# Patient Record
Sex: Female | Born: 2005 | Race: Asian | Hispanic: No | Marital: Single | Smoking: Never smoker
Health system: Southern US, Community
[De-identification: ages and names within clinical notes are randomized; demographics above are authoritative.]

## PROBLEM LIST (undated history)

## (undated) DIAGNOSIS — L309 Dermatitis, unspecified: Secondary | ICD-10-CM

## (undated) DIAGNOSIS — L709 Acne, unspecified: Secondary | ICD-10-CM

## (undated) DIAGNOSIS — E860 Dehydration: Secondary | ICD-10-CM

## (undated) DIAGNOSIS — J302 Other seasonal allergic rhinitis: Secondary | ICD-10-CM

## (undated) HISTORY — DX: Acne, unspecified: L70.9

## (undated) HISTORY — DX: Dermatitis, unspecified: L30.9

## (undated) HISTORY — DX: Dehydration: E86.0

## (undated) HISTORY — DX: Other seasonal allergic rhinitis: J30.2

---
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
## 2024-03-28 ENCOUNTER — Ambulatory Visit: Payer: Self-pay | Admitting: Pediatrics

## 2024-03-28 DIAGNOSIS — Z0000 Encounter for general adult medical examination without abnormal findings: Secondary | ICD-10-CM

## 2024-05-20 ENCOUNTER — Ambulatory Visit (INDEPENDENT_AMBULATORY_CARE_PROVIDER_SITE_OTHER): Payer: Self-pay | Admitting: Pediatrics

## 2024-05-20 ENCOUNTER — Encounter: Payer: Self-pay | Admitting: Pediatrics

## 2024-05-20 VITALS — BP 118/70 | Ht 60.8 in | Wt 113.8 lb

## 2024-05-20 DIAGNOSIS — Z23 Encounter for immunization: Secondary | ICD-10-CM

## 2024-05-20 DIAGNOSIS — Z0000 Encounter for general adult medical examination without abnormal findings: Secondary | ICD-10-CM

## 2024-05-20 DIAGNOSIS — Z1339 Encounter for screening examination for other mental health and behavioral disorders: Secondary | ICD-10-CM | POA: Diagnosis not present

## 2024-05-20 DIAGNOSIS — Z68.41 Body mass index (BMI) pediatric, 5th percentile to less than 85th percentile for age: Secondary | ICD-10-CM | POA: Diagnosis not present

## 2024-05-20 MED ORDER — NORETHIN ACE-ETH ESTRAD-FE 1-20 MG-MCG PO TABS: 1.0000 | ORAL_TABLET | Freq: Every day | ORAL | 11 refills | Status: AC

## 2024-05-20 NOTE — Patient Instructions (Signed)
 At [facility] we value your feedback. You may receive a survey about your visit today. Please share your experience as we strive to create trusting relationships with our patients to provide genuine, compassionate, quality care.   Preventive Care 43-19 Years Old, Female Preventive care refers to lifestyle choices and visits with your health care provider that can promote health and wellness. At this stage in your life, you may start seeing a primary care physician instead of a pediatrician for your preventive care. Preventive care visits are also called wellness exams. What can I expect for my preventive care visit? Counseling During your preventive care visit, your health care provider may ask about your: Medical history, including: Past medical problems. Family medical history. Pregnancy history. Current health, including: Menstrual cycle. Method of birth control. Emotional well-being. Home life and relationship well-being. Sexual activity and sexual health. Lifestyle, including: Alcohol, nicotine or tobacco, and drug use. Access to firearms. Diet, exercise, and sleep habits. Sunscreen use. Motor vehicle safety. Physical exam Your health care provider may check your: Height and weight. These may be used to calculate your BMI (body mass index). BMI is a measurement that tells if you are at a healthy weight. Waist circumference. This measures the distance around your waistline. This measurement also tells if you are at a healthy weight and may help predict your risk of certain diseases, such as type 2 diabetes and high blood pressure. Heart rate and blood pressure. Body temperature. Skin for abnormal spots. Breasts. What immunizations do I need? Vaccines are usually given at various ages, according to a schedule. Your health care provider will recommend vaccines for you based on your age, medical history, and lifestyle or other factors, such as travel or where you  work. What tests do I need? Screening Your health care provider may recommend screening tests for certain conditions. This may include: Vision and hearing tests. Lipid and cholesterol levels. Pelvic exam and Pap test. Hepatitis B test. Hepatitis C test. HIV (human immunodeficiency virus) test. STI (sexually transmitted infection) testing, if you are at risk. Tuberculosis skin test if you have symptoms. BRCA-related cancer screening. This may be done if you have a family history of breast, ovarian, tubal, or peritoneal cancers. Talk with your health care provider about your test results, treatment options, and if necessary, the need for more tests. Follow these instructions at home: Eating and drinking Eat a healthy diet that includes fresh fruits and vegetables, whole grains, lean protein, and low-fat dairy products. Drink enough fluid to keep your urine pale yellow. Do not drink alcohol if: Your health care provider tells you not to drink. You are pregnant, may be pregnant, or are planning to become pregnant. You are under the legal drinking age. In the [place] the legal drinking age is 40. If you drink alcohol: Limit how much you have to 0-1 drink a day. Know how much alcohol is in your drink. In the [place] one drink equals one 12 oz bottle of beer (355 mL), one 5 oz glass of wine (148 mL), or one 1 oz glass of hard liquor (44 mL). Lifestyle Brush your teeth every morning and night with fluoride toothpaste. Floss one time each day. Exercise for at least 30 minutes 5 or more days of the week. Do not use any products that contain nicotine or tobacco. These products include cigarettes, chewing tobacco, and vaping devices, such as e-cigarettes. If you need help quitting, ask your health care provider. Do not use drugs. If you are  sexually active, practice safe sex. Use a condom or other form of protection to prevent STIs. If you do not wish to become pregnant, use a form of birth control.  If you plan to become pregnant, see your health care provider for a prepregnancy visit. Find healthy ways to manage stress, such as: Meditation, yoga, or listening to music. Journaling. Talking to a trusted person. Spending time with friends and family. Safety Always wear your seat belt while driving or riding in a vehicle. Do not drive: If you have been drinking alcohol. Do not ride with someone who has been drinking. When you are tired or distracted. While texting. If you have been using any mind-altering substances or drugs. Wear a helmet and other protective equipment during sports activities. If you have firearms in your house, make sure you follow all gun safety procedures. Seek help if you have been bullied, physically abused, or sexually abused. Use the internet responsibly to avoid dangers, such as online bullying and online sex predators. What's next? Go to your health care provider once a year for an annual wellness visit. Ask your health care provider how often you should have your eyes and teeth checked. Stay up to date on all vaccines. This information is not intended to replace advice given to you by your health care provider. Make sure you discuss any questions you have with your health care provider. Document Revised: 12/16/2020 Document Reviewed: 12/16/2020 Elsevier Patient Education  2024 [employer].

## 2024-05-20 NOTE — Progress Notes (Unsigned)
 Subjective:     History was provided by the patient.  Angelica Bradley is a 18 y.o. female who is here for this well-child visit.  Immunization History  Administered Date(s) Administered   DTaP 08/03/2005, 10/12/2005, 12/20/2005, 08/30/2006, 06/10/2009   HIB (PRP-OMP) 08/03/2005, 10/12/2005, 04/20/2006, 06/10/2009   HPV 9-valent 01/28/2020, 02/08/2021   Hepatitis A 08/30/2006, 08/28/2007   Hepatitis B Feb 11, 2005, 08/03/2005, 04/20/2006   IPV 08/03/2005, 10/12/2005, 04/20/2006, 06/10/2009   Influenza Nasal 03/24/2011, 04/26/2012   Influenza Split 06/06/2007, 02/26/2010   Influenza, Seasonal, Injecte, Preservative Fre 03/22/2023   Influenza,Quad,Nasal, Live 03/20/2013, 05/09/2014   Influenza,inj,Quad PF,6+ Mos 04/24/2015, 03/22/2016, 04/27/2017, 02/16/2018, 04/11/2019, 03/26/2020, 05/05/2021, 03/18/2022   MMR 08/30/2006, 06/10/2009   MenQuadfi_Meningococcal Groups ACYW Conjugate 03/18/2022   Meningococcal B, OMV 03/22/2023   Meningococcal Conjugate 02/16/2018   PFIZER(Purple Top)SARS-COV-2 Vaccination 01/31/2020, 02/21/2020, 08/07/2020   Pfizer Covid-19 Vaccine Bivalent Booster 12yrs & up 07/30/2021   Pneumococcal Conjugate-13 08/03/2005, 10/12/2005, 12/20/2005, 08/30/2006   Rotavirus Pentavalent 08/03/2005, 10/12/2005   Tdap 02/16/2018   Varicella 08/30/2006, 04/10/2009   The following portions of the patient's history were reviewed and updated as appropriate: allergies, current medications, past family history, past medical history, past social history, past surgical history, and problem list.  Current Issues: Current concerns include would like to start birth control. Currently menstruating? yes; current menstrual pattern: regular every month without intermenstrual spotting Sexually active? yes - inconsistent condom use  Does patient snore? no   Review of Nutrition: Current diet: meats, vegetables, fruit, water, calcium in the diet Balanced diet? yes  Social Screening:   Parental relations: good Sibling relations: brothers: 1 younger Discipline concerns? no Concerns regarding behavior with peers? no School performance: doing well; no concerns Secondhand smoke exposure? no  Screening Questions: Risk factors for anemia: no Risk factors for vision problems: no Risk factors for hearing problems: no Risk factors for tuberculosis: no Risk factors for dyslipidemia: no Risk factors for sexually-transmitted infections: yes - inconsistent condom use Risk factors for alcohol/drug use:  no    Objective:     Vitals:   05/20/24 0935  BP: 118/70  Weight: 113 lb 12.8 oz (51.6 kg)  Height: 5' 0.8 (1.544 m)   Growth parameters are noted and are appropriate for age.  General:   alert, cooperative, appears stated age, and no distress  Gait:   normal  Skin:   normal  Oral cavity:   lips, mucosa, and tongue normal; teeth and gums normal  Eyes:   sclerae white, pupils equal and reactive, red reflex normal bilaterally  Ears:   normal bilaterally  Neck:   no adenopathy, no carotid bruit, no JVD, supple, symmetrical, trachea midline, and thyroid not enlarged, symmetric, no tenderness/mass/nodules  Lungs:  clear to auscultation bilaterally  Heart:   regular rate and rhythm, S1, S2 normal, no murmur, click, rub or gallop and normal apical impulse  Abdomen:  soft, non-tender; bowel sounds normal; no masses,  no organomegaly  GU:  exam deferred  Tanner Stage:   B5  Extremities:  extremities normal, atraumatic, no cyanosis or edema  Neuro:  normal without focal findings, mental status, speech normal, alert and oriented x3, PERLA, and reflexes normal and symmetric     Assessment:    Well adolescent.    Plan:    1. Anticipatory guidance discussed. Specific topics reviewed: bicycle helmets, breast self-exam, drugs, ETOH, and tobacco, importance of regular dental care, importance of regular exercise, importance of varied diet, limit TV, media violence, minimize  junk food,  puberty, safe storage of any firearms in the home, seat belts, and sex; STD and pregnancy prevention.  2.  Weight management:  The patient was counseled regarding nutrition and physical activity.  3. Development: appropriate for age  64. Immunizations today:MenB and Flu vaccines per orders. Indications, contraindications and side effects of vaccine/vaccines discussed with parent and parent verbally expressed understanding and also agreed with the administration of vaccine/vaccines as ordered above today.Handout (VIS) given for each vaccine at this visit. History of previous adverse reactions to immunizations? no  5. Follow-up visit in 1 year for next well child visit, or sooner as needed.  6. OCP benefits and possible side effects reviewed. Prescription sent to pharmacy.
